# Patient Record
Sex: Female | Born: 2000 | Hispanic: Yes | Marital: Single | Smoking: Never smoker
Health system: Southern US, Community
[De-identification: ages and names within clinical notes are randomized; demographics above are authoritative.]

---

## 2020-01-02 ENCOUNTER — Other Ambulatory Visit: Payer: Self-pay

## 2020-01-02 ENCOUNTER — Encounter (HOSPITAL_BASED_OUTPATIENT_CLINIC_OR_DEPARTMENT_OTHER): Payer: Self-pay

## 2020-01-02 ENCOUNTER — Emergency Department (HOSPITAL_BASED_OUTPATIENT_CLINIC_OR_DEPARTMENT_OTHER): Payer: BC Managed Care – PPO

## 2020-01-02 ENCOUNTER — Emergency Department (HOSPITAL_BASED_OUTPATIENT_CLINIC_OR_DEPARTMENT_OTHER)
Admission: EM | Admit: 2020-01-02 | Discharge: 2020-01-02 | Disposition: A | Discharge status: Home / Self Care | Payer: BC Managed Care – PPO | Attending: Emergency Medicine | Admitting: Emergency Medicine

## 2020-01-02 DIAGNOSIS — Z79899 Other long term (current) drug therapy: Secondary | ICD-10-CM | POA: Insufficient documentation

## 2020-01-02 DIAGNOSIS — R0781 Pleurodynia: Secondary | ICD-10-CM | POA: Diagnosis not present

## 2020-01-02 DIAGNOSIS — S7002XA Contusion of left hip, initial encounter: Secondary | ICD-10-CM | POA: Insufficient documentation

## 2020-01-02 DIAGNOSIS — S80912A Unspecified superficial injury of left knee, initial encounter: Secondary | ICD-10-CM | POA: Diagnosis present

## 2020-01-02 DIAGNOSIS — M25562 Pain in left knee: Secondary | ICD-10-CM | POA: Insufficient documentation

## 2020-01-02 DIAGNOSIS — M546 Pain in thoracic spine: Secondary | ICD-10-CM | POA: Insufficient documentation

## 2020-01-02 DIAGNOSIS — S8002XA Contusion of left knee, initial encounter: Secondary | ICD-10-CM | POA: Diagnosis not present

## 2020-01-02 DIAGNOSIS — Y999 Unspecified external cause status: Secondary | ICD-10-CM | POA: Diagnosis not present

## 2020-01-02 DIAGNOSIS — M542 Cervicalgia: Secondary | ICD-10-CM | POA: Diagnosis not present

## 2020-01-02 DIAGNOSIS — Y939 Activity, unspecified: Secondary | ICD-10-CM | POA: Diagnosis not present

## 2020-01-02 DIAGNOSIS — Y929 Unspecified place or not applicable: Secondary | ICD-10-CM | POA: Insufficient documentation

## 2020-01-02 DIAGNOSIS — M7918 Myalgia, other site: Secondary | ICD-10-CM

## 2020-01-02 DIAGNOSIS — M545 Low back pain: Secondary | ICD-10-CM | POA: Diagnosis not present

## 2020-01-02 DIAGNOSIS — M25512 Pain in left shoulder: Secondary | ICD-10-CM

## 2020-01-02 MED ORDER — NAPROXEN 500 MG PO TABS
500.0000 mg | ORAL_TABLET | Freq: Two times a day (BID) | ORAL | 0 refills | Status: AC
Start: 2020-01-02 — End: ?

## 2020-01-02 MED ORDER — METHOCARBAMOL 500 MG PO TABS
500.0000 mg | ORAL_TABLET | Freq: Two times a day (BID) | ORAL | 0 refills | Status: AC
Start: 2020-01-02 — End: ?

## 2020-01-02 MED FILL — METHOCARBAMOL 500 MG TABS: 500 | 10 days supply | Qty: 20 | Fill #0

## 2020-01-02 MED FILL — NAPROXEN 500 MG TABS: 500 | 15 days supply | Qty: 30 | Fill #0

## 2020-01-02 NOTE — ED Notes (Signed)
ED Provider at bedside. 
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
## 2020-01-02 NOTE — ED Notes (Signed)
Pt discharged to home. Discharge instructions have been discussed with patient and/or family members. Pt verbally acknowledges understanding d/c instructions, and endorses comprehension to checkout at registration before leaving.  °
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
## 2022-07-28 ENCOUNTER — Encounter: Payer: Self-pay | Admitting: Nurse Practitioner

## 2022-08-21 ENCOUNTER — Ambulatory Visit: Payer: BC Managed Care – PPO | Admitting: Nurse Practitioner

## 2022-08-21 NOTE — Progress Notes (Deleted)
     08/21/2022 Wanda Warren July 25, 2000   CHIEF COMPLAINT: Constipation  HISTORY OF PRESENT ILLNESS: Wanda Warren is a 22 year old female with no significant past medical history.  She presents today for further evaluation regarding constipation.  [facility] was seen at [facility] due to having constipation, no BM for 4 days.  Abdominal x-ray was normal.  She was prescribed docusate sodium 100 mg 1 capsule daily.   Abdominal xray 07/22/2022: Findings The bowel gas pattern is within normal limits  There are no suspicious calcifications identified.  There is no acute osseous abnormality  Impression The bowel gas pattern is within normal limits. No suspicious calcifications.    No past medical history on file. No past surgical history on file. Social History:  Family History:    reports that she has never smoked. She has never used smokeless tobacco. She reports that she does not drink alcohol and does not use drugs. family history is not on file. No Known Allergies    Outpatient Encounter Medications as of 08/21/2022  Medication Sig   methocarbamol (ROBAXIN) 500 MG tablet Take 1 tablet (500 mg total) by mouth 2 (two) times daily.   naproxen (NAPROSYN) 500 MG tablet Take 1 tablet (500 mg total) by mouth 2 (two) times daily.   No facility-administered encounter medications on file as of 08/21/2022.     REVIEW OF SYSTEMS:  Gen: Denies fever, sweats or chills. No weight loss.  CV: Denies chest pain, palpitations or edema. Resp: Denies cough, shortness of breath of hemoptysis.  GI: Denies heartburn, dysphagia, stomach or lower abdominal pain. No diarrhea or constipation.  GU : Denies urinary burning, blood in urine, increased urinary frequency or incontinence. MS: Denies joint pain, muscles aches or weakness. Derm: Denies rash, itchiness, skin lesions or unhealing ulcers. Psych: Denies depression, anxiety, memory loss or confusion. Heme: Denies  bruising, easy bleeding. Neuro:  Denies headaches, dizziness or paresthesias. Endo:  Denies any problems with DM, thyroid or adrenal function.  PHYSICAL EXAM: There were no vitals taken for this visit. General: in no acute distress. Head: Normocephalic and atraumatic. Eyes:  Sclerae non-icteric, conjunctive pink. Ears: Normal auditory acuity. Mouth: Dentition intact. No ulcers or lesions.  Neck: Supple, no lymphadenopathy or thyromegaly.  Lungs: Clear bilaterally to auscultation without wheezes, crackles or rhonchi. Heart: Regular rate and rhythm. No murmur, rub or gallop appreciated.  Abdomen: Soft, nontender, nondistended. No masses. No hepatosplenomegaly. Normoactive bowel sounds x 4 quadrants.  Rectal:  Musculoskeletal: Symmetrical with no gross deformities. Skin: Warm and dry. No rash or lesions on visible extremities. Extremities: No edema. Neurological: Alert oriented x 4, no focal deficits.  Psychological:  Alert and cooperative. Normal mood and affect.  ASSESSMENT AND PLAN:    CC:  Sarina Ser, MD

## 2022-11-09 ENCOUNTER — Ambulatory Visit: Payer: BC Managed Care – PPO | Admitting: Podiatry

## 2022-11-09 ENCOUNTER — Ambulatory Visit: Payer: BC Managed Care – PPO

## 2022-11-09 ENCOUNTER — Encounter: Payer: Self-pay | Admitting: Podiatry

## 2022-11-09 DIAGNOSIS — M7671 Peroneal tendinitis, right leg: Secondary | ICD-10-CM

## 2022-11-09 DIAGNOSIS — R52 Pain, unspecified: Secondary | ICD-10-CM

## 2022-11-09 DIAGNOSIS — S93401A Sprain of unspecified ligament of right ankle, initial encounter: Secondary | ICD-10-CM

## 2022-11-09 NOTE — Patient Instructions (Signed)
Ankle Sprain, Phase I Rehab An ankle sprain is an injury to the ligaments of your ankle. Ankle sprains cause stiffness, loss of motion, and loss of strength. Ask your health care provider which exercises are safe for you. Do exercises exactly as told by your health care provider and adjust them as directed. It is normal to feel mild stretching, pulling, tightness, or discomfort as you do these exercises. Stop right away if you feel sudden pain or your pain gets worse. Do not begin these exercises until told by your health care provider. Stretching and range-of-motion exercises These exercises warm up your muscles and joints and improve the movement and flexibility of your lower leg and ankle. These exercises also help to relieve pain and stiffness. Gastroc and soleus stretch This exercise is also called a calf stretch. It stretches the muscles in the back of the lower leg. These muscles are the gastrocnemius, or gastroc, and the soleus. Sit on the floor with your left / right leg extended. Loop a belt or towel around the ball of your left / right foot. The ball of your foot is on the walking surface, right under your toes. Keep your left / right ankle and foot relaxed and keep your knee straight while you use the belt or towel to pull your foot toward you. You should feel a gentle stretch behind your calf or knee in your gastroc muscle. Hold this position for __________ seconds, then release to the starting position. Repeat the exercise with your knee bent. You can put a pillow or a rolled bath towel under your knee to support it. You should feel a stretch deep in your calf in the soleus muscle or at your Achilles tendon. Repeat __________ times. Complete this exercise __________ times a day. Ankle alphabet  Sit with your left / right leg supported at the lower leg. Do not rest your foot on anything. Make sure your foot has room to move freely. Think of your left / right foot as a paintbrush. Move  your foot to trace each letter of the alphabet in the air. Keep your hip and knee still while you trace. Make the letters as large as you can without feeling discomfort. Trace every letter from A to Z. Repeat __________ times. Complete this exercise __________ times a day. Strengthening exercises These exercises build strength and endurance in your ankle and lower leg. Endurance is the ability to use your muscles for a long time, even after they get tired. Ankle dorsiflexion  Secure a rubber exercise band or tube to an object, such as a table leg, that will stay still when the band is pulled. Secure the other end around your left / right foot. Sit on the floor facing the object, with your left / right leg extended. The band or tube should be slightly tense when your foot is relaxed. Slowly bring your foot toward you, bringing the top of your foot toward your shin (dorsiflexion), and pulling the band tighter. Hold this position for __________ seconds. Slowly return your foot to the starting position. Repeat __________ times. Complete this exercise __________ times a day. Ankle plantar flexion  Sit on the floor with your left / right leg extended. Loop a rubber exercise tube or band around the ball of your left / right foot. The ball of your foot is on the walking surface, right under your toes. Hold the ends of the band or tube in your hands. The band or tube should be slightly   tense when your foot is relaxed. Slowly point your foot and toes downward to tilt the top of your foot away from your shin (plantar flexion). Hold this position for __________ seconds. Slowly return your foot to the starting position. Repeat __________ times. Complete this exercise __________ times a day. Ankle eversion Sit on the floor with your legs straight out in front of you. Loop a rubber exercise band or tube around the ball of your left / right foot. The ball of your foot is on the walking surface, right under  your toes. Hold the ends of the band in your hands, or secure the band to a stable object. The band or tube should be slightly tense when your foot is relaxed. Slowly push your foot outward, away from your other leg (eversion). Hold this position for __________ seconds. Slowly return your foot to the starting position. Repeat __________ times. Complete this exercise __________ times a day. This information is not intended to replace advice given to you by your health care provider. Make sure you discuss any questions you have with your health care provider. Document Revised: 08/06/2020 Document Reviewed: 08/08/2020 Elsevier Patient Education  2023 [employer]  

## 2022-11-09 NOTE — Progress Notes (Signed)
  Subjective:  Patient ID: Wanda Warren, female    DOB: Oct 24, 2000,   MRN: [id number]  Chief Complaint  Patient presents with   Foot Injury    Patient fell on ankle kating the 1st time and the 2nd time patient stated she fell wearing heels, 2 weeks ago was when the incident happened, swelling occurred when it happened, patient feels there is still some swellingproblem     22 y.o. female presents for concern of right foot and ankle injury. Relates she has injured the ankle twice. Relates she has had swelling and pain. This occurred two weeks ago. Relates when she bends down she hears a crack and gets pain.  . Denies any other pedal complaints. Denies n/v/f/c.   History reviewed. No pertinent past medical history.  Objective:  Physical Exam: Vascular: DP/PT pulses 2/4 bilateral. CFT <3 seconds. Normal hair growth on digits. No edema.  Skin. No lacerations or abrasions bilateral feet.  Musculoskeletal: MMT 5/5 bilateral lower extremities in DF, PF, Inversion and Eversion. Deceased ROM in DF of ankle joint. Mildy tender around ATFL and CFL negative anterior drawer test. Pain along the peroneal tendon from distal lateral malleolus proixmally. Pain with DF PF and eversion of the foot.  Neurological: Sensation intact to light touch.   Assessment:   1. Moderate right ankle sprain, initial encounter   2. Peroneal tendonitis, right      Plan:  Patient was evaluated and treated and all questions answered. X-rays reviewed and discussed with patient. No acute fractures or dislocations noted.  Discussed peroneal tendinitis and ankle sprain.  and treatment options at length with patient Discussed stretching exercises and provided handout. Anti-inflammatories as neeeded.  Discussed icing and elevating.  Dispensed Tri-Lock ankle brace. Discussed that if the symptoms do not improve can consider PT/MRI. Patient to return in 6 to 8 weeks or sooner if symptoms fail to improve or  worsen.    Louann Sjogren, DPM

## 2022-12-28 ENCOUNTER — Ambulatory Visit: Payer: BC Managed Care – PPO | Admitting: Podiatry
